# Patient Record
Sex: Male | Born: 1955 | Race: White | Hispanic: No | Smoking: Current every day smoker
Health system: Southern US, Community
[De-identification: ages and names within clinical notes are randomized; demographics above are authoritative.]

## PROBLEM LIST (undated history)

## (undated) DIAGNOSIS — E119 Type 2 diabetes mellitus without complications: Secondary | ICD-10-CM

## (undated) DIAGNOSIS — I10 Essential (primary) hypertension: Secondary | ICD-10-CM

## (undated) DIAGNOSIS — J449 Chronic obstructive pulmonary disease, unspecified: Secondary | ICD-10-CM

---

## 2017-04-10 ENCOUNTER — Other Ambulatory Visit: Payer: Self-pay

## 2017-04-10 ENCOUNTER — Emergency Department (HOSPITAL_COMMUNITY): Payer: Medicare Other

## 2017-04-10 ENCOUNTER — Encounter (HOSPITAL_COMMUNITY): Payer: Self-pay

## 2017-04-10 ENCOUNTER — Emergency Department (HOSPITAL_COMMUNITY)
Admission: EM | Admit: 2017-04-10 | Discharge: 2017-04-11 | Disposition: A | Discharge status: Home / Self Care | Payer: Medicare Other | Attending: Emergency Medicine | Admitting: Emergency Medicine

## 2017-04-10 DIAGNOSIS — R079 Chest pain, unspecified: Secondary | ICD-10-CM

## 2017-04-10 DIAGNOSIS — H538 Other visual disturbances: Secondary | ICD-10-CM | POA: Diagnosis not present

## 2017-04-10 DIAGNOSIS — R739 Hyperglycemia, unspecified: Secondary | ICD-10-CM

## 2017-04-10 DIAGNOSIS — E1165 Type 2 diabetes mellitus with hyperglycemia: Secondary | ICD-10-CM | POA: Diagnosis not present

## 2017-04-10 DIAGNOSIS — R0789 Other chest pain: Secondary | ICD-10-CM | POA: Diagnosis present

## 2017-04-10 DIAGNOSIS — R55 Syncope and collapse: Secondary | ICD-10-CM | POA: Diagnosis not present

## 2017-04-10 DIAGNOSIS — R0602 Shortness of breath: Secondary | ICD-10-CM | POA: Insufficient documentation

## 2017-04-10 DIAGNOSIS — F17210 Nicotine dependence, cigarettes, uncomplicated: Secondary | ICD-10-CM | POA: Diagnosis not present

## 2017-04-10 DIAGNOSIS — R51 Headache: Secondary | ICD-10-CM | POA: Diagnosis not present

## 2017-04-10 DIAGNOSIS — I10 Essential (primary) hypertension: Secondary | ICD-10-CM | POA: Insufficient documentation

## 2017-04-10 DIAGNOSIS — J449 Chronic obstructive pulmonary disease, unspecified: Secondary | ICD-10-CM | POA: Insufficient documentation

## 2017-04-10 DIAGNOSIS — R42 Dizziness and giddiness: Secondary | ICD-10-CM

## 2017-04-10 HISTORY — DX: Type 2 diabetes mellitus without complications: E11.9

## 2017-04-10 HISTORY — DX: Chronic obstructive pulmonary disease, unspecified: J44.9

## 2017-04-10 HISTORY — DX: Essential (primary) hypertension: I10

## 2017-04-10 LAB — I-STAT TROPONIN, ED
Troponin i, poc: 0 ng/mL (ref 0.00–0.08)
Troponin i, poc: 0 ng/mL (ref 0.00–0.08)

## 2017-04-10 LAB — CBC
HEMATOCRIT: 39.5 % (ref 39.0–52.0)
HEMOGLOBIN: 13.9 g/dL (ref 13.0–17.0)
MCH: 28.6 pg (ref 26.0–34.0)
MCHC: 35.2 g/dL (ref 30.0–36.0)
MCV: 81.3 fL (ref 78.0–100.0)
Platelets: 289 10*3/uL (ref 150–400)
RBC: 4.86 MIL/uL (ref 4.22–5.81)
RDW: 13.1 % (ref 11.5–15.5)
WBC: 14.6 10*3/uL — ABNORMAL HIGH (ref 4.0–10.5)

## 2017-04-10 LAB — BASIC METABOLIC PANEL
ANION GAP: 14 (ref 5–15)
BUN: 12 mg/dL (ref 6–20)
CALCIUM: 9.1 mg/dL (ref 8.9–10.3)
CHLORIDE: 91 mmol/L — AB (ref 101–111)
CO2: 25 mmol/L (ref 22–32)
Creatinine, Ser: 0.78 mg/dL (ref 0.61–1.24)
GFR calc non Af Amer: >60 mL/min (ref >60)
Glucose, Bld: 432 mg/dL — ABNORMAL HIGH (ref 65–99)
Potassium: 3.6 mmol/L (ref 3.5–5.1)
Sodium: 130 mmol/L — ABNORMAL LOW (ref 135–145)

## 2017-04-10 LAB — CBG MONITORING, ED: GLUCOSE-CAPILLARY: 320 mg/dL — AB (ref 65–99)

## 2017-04-10 MED ORDER — SODIUM CHLORIDE 0.9 % IV BOLUS (SEPSIS)
1000.0000 mL | Freq: Once | INTRAVENOUS | Status: AC
  Administered 2017-04-10: 1000 mL via INTRAVENOUS

## 2017-04-10 NOTE — ED Triage Notes (Signed)
Pt endorses left sided chest pain x 2 days and has been having episodes of dizziness, blurred vison and "passing out" "for a long time" VSS in triage. Pt is homeless. No neuro deficits.

## 2017-04-10 NOTE — ED Notes (Signed)
Pt alert and oriented x4. Pt talking in complete sentences with clear speech. Skin warm and dry. Respirations equal and unlabored. Abdomen soft and non distended. Pt states has dizziness, denies pain or weakness. Pt has 2 large bag of clothing/belongings at bedside.

## 2017-04-10 NOTE — ED Provider Notes (Signed)
Kenneth Finley [facility] EMERGENCY DEPARTMENT Provider Note   CSN: [id number] Arrival date & time: 04/10/17  1726     History   Chief Complaint Chief Complaint  Patient presents with  . Chest Pain    HPI BUEL MOLDER is a 62 y.o. male.  Patient with history of diabetes, reported hypertension, COPD, previous stroke --presents the emergency department tonight with a complaint of several days of chest pains as well as dizziness and also blurred vision.  Patient states that he was recently seen at [facility] and admitted overnight.  He remembers having a head CT and other testing done but cannot give further details.  Pain is described as left-sided chest pressure with radiation to the shoulder.  He has associated shortness of breath.  He denies fever, cough, nausea or vomiting.  He states that he has had dizziness with associated headache.  It is described as a spinning sensation with associated blurry vision.  Patient has been told that his blurry vision is related to his high blood sugar. Patient denies signs of stroke including: facial droop, slurred speech, aphasia, weakness/numbness in extremities, imbalance/trouble walking.  Patient states that he syncopized 3 times today while on a bus to [place].  He states that he hit his head but denies having any physical injury from this.  No neck pain.  No treatments prior to arrival.  Patient states that he currently takes metformin 1000 mg daily for his diabetes.  He reports a history of hypertension, smoking, DM and a family history of cardiac disease in his mother.  He has never had a heart attack himself.  Denies high cholesterol.  Patient denies risk factors for pulmonary embolism including: unilateral leg swelling, history of DVT/PE/other blood clots, use of exogenous hormones, recent immobilizations, recent surgery, recent travel (>4hr segment), malignancy, hemoptysis. T       Past Medical History:  Diagnosis Date  .  COPD (chronic obstructive pulmonary disease) (HCC)   . Diabetes mellitus without complication (HCC)   . Hypertension     There are no active problems to display for this patient.   History reviewed. No pertinent surgical history.     Home Medications    Prior to Admission medications   Not on File    Family History History reviewed. No pertinent family history.  Social History Social History   Tobacco Use  . Smoking status: Current Every Day Smoker    Packs/day: 0.50    Types: Cigarettes  Substance Use Topics  . Alcohol use: No    Frequency: Never  . Drug use: No     Allergies   Bee venom; Tetanus toxoids; Morphine and related; and Penicillins   Review of Systems Review of Systems  Constitutional: Negative for diaphoresis and fever.  HENT: Negative for congestion, dental problem, rhinorrhea and sinus pressure.   Eyes: Positive for visual disturbance. Negative for photophobia, discharge and redness.  Respiratory: Positive for shortness of breath. Negative for cough.   Cardiovascular: Positive for chest pain. Negative for palpitations and leg swelling.  Gastrointestinal: Negative for abdominal pain, nausea and vomiting.  Genitourinary: Negative for dysuria.  Musculoskeletal: Negative for back pain, gait problem, neck pain and neck stiffness.  Skin: Negative for rash.  Neurological: Positive for syncope and headaches. Negative for speech difficulty, weakness, light-headedness and numbness.  Psychiatric/Behavioral: Negative for confusion. The patient is not nervous/anxious.      Physical Exam Updated Vital Signs BP 123/74   Pulse (!) 179   Temp (!)  97.5 F (36.4 C) (Oral)   Resp (!) 27   SpO2 92%   Physical Exam  Constitutional: He is oriented to person, place, and time. He appears well-developed and well-nourished.  HENT:  Head: Normocephalic and atraumatic.  Right Ear: Tympanic membrane, external ear and ear canal normal.  Left Ear: Tympanic  membrane, external ear and ear canal normal.  Nose: Nose normal.  Mouth/Throat: Uvula is midline, oropharynx is clear and moist and mucous membranes are normal.  Eyes: Conjunctivae, EOM and lids are normal. Pupils are equal, round, and reactive to light. Right eye exhibits no discharge. Left eye exhibits no discharge.  Neck: Normal range of motion. Neck supple.  Cardiovascular: Normal rate, regular rhythm and normal heart sounds.  No murmur heard. HR 90's  Pulmonary/Chest: Effort normal and breath sounds normal. He has no decreased breath sounds. He has no wheezes. He has no rales.  Abdominal: Soft. There is no tenderness.  Musculoskeletal: Normal range of motion. He exhibits no edema or tenderness.       Cervical back: He exhibits normal range of motion, no tenderness and no bony tenderness.  No clinical signs and symptoms of DVT.  Neurological: He is alert and oriented to person, place, and time. He has normal strength and normal reflexes. No cranial nerve deficit or sensory deficit. He exhibits normal muscle tone. He displays a negative Romberg sign. Coordination and gait normal. GCS eye subscore is 4. GCS verbal subscore is 5. GCS motor subscore is 6.  Skin: Skin is warm and dry.  Psychiatric: He has a normal mood and affect.  Nursing note and vitals reviewed.    ED Treatments / Results  Labs (all labs ordered are listed, but only abnormal results are displayed) Labs Reviewed  BASIC METABOLIC PANEL - Abnormal; Notable for the following components:      Result Value   Sodium 130 (*)    Chloride 91 (*)    Glucose, Bld 432 (*)    All other components within normal limits  CBC - Abnormal; Notable for the following components:   WBC 14.6 (*)    All other components within normal limits  CBG MONITORING, ED - Abnormal; Notable for the following components:   Glucose-Capillary 320 (*)    All other components within normal limits  D-DIMER, QUANTITATIVE (NOT AT [facility])  I-STAT TROPONIN,  ED  I-STAT TROPONIN, ED    EKG  EKG Interpretation  Date/Time:  Monday April 10 2017 23:17:36 EST Ventricular Rate:  89 PR Interval:    QRS Duration: 84 QT Interval:  378 QTC Calculation: 460 R Axis:   50 Text Interpretation:  Sinus rhythm Confirmed by Ross Marcus ([id number]) on 04/11/2017 1:56:14 AM       Radiology Dg Chest 2 View  Result Date: 04/10/2017 CLINICAL DATA:  Left chest pain for 2 days. Dizziness with blurred vision. History of syncopal episodes. EXAM: CHEST  2 VIEW COMPARISON:  None. FINDINGS: The heart size and mediastinal contours are normal without evidence of adenopathy. The lungs appear mildly hyperinflated with mild central airway thickening. There is a well-circumscribed, calcified right lower lobe granuloma. No suspicious pulmonary nodule or confluent airspace opacity. There is no pleural effusion or pneumothorax. No acute osseous findings are seen. IMPRESSION: No apparent acute cardiopulmonary process. Right lung granuloma and diffuse central airway thickening. Electronically Signed   By: Carey Bullocks M.D.   On: 04/10/2017 18:28    Procedures Procedures (including critical care time)  Medications Ordered in ED Medications  sodium chloride 0.9 % bolus 1,000 mL (0 mLs Intravenous Stopped 04/11/17 0052)     Initial Impression / Assessment and Plan / ED Course  I have reviewed the triage vital signs and the nursing notes.  Pertinent labs & imaging results that were available during my care of the patient were reviewed by me and considered in my medical decision making (see chart for details).     Patient seen and examined.  Initial workup demonstrates hyperglycemia without DKA.  Also elevated white blood cell count.  No pneumonia on chest x-ray.  Vital signs reviewed and are as follows: BP 123/74   Pulse (!) 179   Temp (!) 97.5 F (36.4 C) (Oral)   Resp (!) 27   SpO2 92%   ED ECG REPORT   Date: 04/11/2017  Rate:  115  Rhythm: sinus  tachycardia  QRS Axis: normal  Intervals: normal  ST/T Wave abnormalities: nonspecific ST/T changes  Conduction Disutrbances:none  Narrative Interpretation:   Old EKG Reviewed: none available  I have personally reviewed the EKG tracing and agree with the computerized printout as noted.   Will check d-dimer, troponin and repeat EKG.  [facility] everywhere.  Vital signs at 2330 show heart rate of 179.  I went and checked on the patient and heart rate was in the 80s on the monitor.  Suspect this is an error.  12:05 AM D-dimer pending. 2nd trop negative.   ED ECG REPORT   Date: 04/11/2017  Rate: 89  Rhythm: normal sinus rhythm  QRS Axis: normal  Intervals: normal  ST/T Wave abnormalities: normal  Conduction Disutrbances:none  Narrative Interpretation:   Old EKG Reviewed: changes noted, slower  I have personally reviewed the EKG tracing and agree with the computerized printout as noted.  12:18 AM D-dimer needed to be reordered. Pending.   2:08 AM I discussed patient and workup, recent history with Dr. Wilkie AyeHorton.  Agrees, no indications for admission or further workup.  I discussed results again with the patient in his room.  He is agreeable to discharge.  Discussed need to follow-up with primary care, referral given.  Also given referrals for shelters and social services.  Patient was counseled to return with severe chest pain, especially if the pain is crushing or pressure-like and spreads to the arms, back, neck, or jaw, or if they have sweating, nausea, or shortness of breath with the pain. They were encouraged to call 911 with these symptoms.   The patient verbalized understanding and agreed.    Final Clinical Impressions(s) / ED Diagnoses   Final diagnoses:  Chest pain, unspecified type  Dizziness  Hyperglycemia   Chest pain: EKG unchanged and troponin negative x2.  Chest x-ray   Does not show pneumonia.  D-dimer is  negative.  Dizziness: No focal neuro deficits on exam.  Patient has not developed any deficits during ED stay.  He has ambulated.  He is not ataxic.  I do not suspect a posterior circulation stroke.  Question syncope today.  Possibly dehydrated due to uncontrolled diabetes.  Fluids given here.  Hyperglycemia: No signs of DKA.  Treated with IV fluids with improvement in blood sugar to 300.  Patient to continue metformin.  ED Discharge Orders    None       Renne CriglerGeiple, Dilcia Rybarczyk, PA-C 04/11/17 0211    Shon BatonHorton, Courtney F, MD 04/11/17 [phone]

## 2017-04-11 LAB — CBG MONITORING, ED: GLUCOSE-CAPILLARY: 300 mg/dL — AB (ref 65–99)

## 2017-04-11 LAB — D-DIMER, QUANTITATIVE: D-Dimer, Quant: 0.49 ug/mL-FEU (ref 0.00–0.50)

## 2017-04-11 NOTE — ED Notes (Signed)
Pt maintained oxygen saturation above 94% while ambulating to the end of the hall. Pt states has been walking all day with luggage and his feet were weak from long walk trip. Pt walked with assistance.

## 2017-04-11 NOTE — ED Notes (Signed)
CBG 300. 

## 2017-04-11 NOTE — Discharge Instructions (Signed)
Please read and follow all provided instructions.  Your diagnoses today include:  1. Chest pain, unspecified type   2. Dizziness   3. Hyperglycemia     Tests performed today include: An EKG of your heart A chest x-ray Cardiac enzymes - a blood test for heart muscle damage, no sign of heart attack Blood counts and electrolytes Blood test for blood clot -no sign of blood clot Vital signs. See below for your results today.   Medications prescribed:  None   Take any prescribed medications only as directed.  Follow-up instructions: Please follow-up with your primary care provider as soon as you can for further evaluation of your symptoms.   Return instructions:  SEEK IMMEDIATE MEDICAL ATTENTION IF: You have severe chest pain, especially if the pain is crushing or pressure-like and spreads to the arms, back, neck, or jaw, or if you have sweating, nausea (feeling sick to your stomach), or shortness of breath. THIS IS AN EMERGENCY. Don't wait to see if the pain will go away. Get medical help at once. Call 911 or 0 (operator). DO NOT drive yourself to the hospital.  Your chest pain gets worse and does not go away with rest.  You have an attack of chest pain lasting longer than usual, despite rest and treatment with the medications your caregiver has prescribed.  You wake from sleep with chest pain or shortness of breath. You feel dizzy or faint. You have chest pain not typical of your usual pain for which you originally saw your caregiver.  You have any other emergent concerns regarding your health.  Additional Information: Chest pain comes from many different causes. Your caregiver has diagnosed you as having chest pain that is not specific for one problem, but does not require admission.  You are at low risk for an acute heart condition or other serious illness.   Your vital signs today were: BP 118/75    Pulse 90    Temp (!) 97.5 F (36.4 C) (Oral)    Resp (!) 26    SpO2 91%  If  your blood pressure (BP) was elevated above 135/85 this visit, please have this repeated by your doctor within one month. --------------

## 2017-06-22 ENCOUNTER — Emergency Department (HOSPITAL_COMMUNITY): Payer: Medicare Other

## 2017-06-22 ENCOUNTER — Other Ambulatory Visit: Payer: Self-pay

## 2017-06-22 ENCOUNTER — Encounter (HOSPITAL_COMMUNITY): Payer: Self-pay | Admitting: Emergency Medicine

## 2017-06-22 ENCOUNTER — Emergency Department (HOSPITAL_COMMUNITY)
Admission: EM | Admit: 2017-06-22 | Discharge: 2017-06-23 | Disposition: A | Discharge status: Home / Self Care | Payer: Medicare Other | Attending: Emergency Medicine | Admitting: Emergency Medicine

## 2017-06-22 DIAGNOSIS — E860 Dehydration: Secondary | ICD-10-CM

## 2017-06-22 DIAGNOSIS — E119 Type 2 diabetes mellitus without complications: Secondary | ICD-10-CM | POA: Diagnosis not present

## 2017-06-22 DIAGNOSIS — R42 Dizziness and giddiness: Secondary | ICD-10-CM

## 2017-06-22 DIAGNOSIS — J449 Chronic obstructive pulmonary disease, unspecified: Secondary | ICD-10-CM | POA: Insufficient documentation

## 2017-06-22 DIAGNOSIS — F17210 Nicotine dependence, cigarettes, uncomplicated: Secondary | ICD-10-CM | POA: Diagnosis not present

## 2017-06-22 DIAGNOSIS — I10 Essential (primary) hypertension: Secondary | ICD-10-CM | POA: Insufficient documentation

## 2017-06-22 DIAGNOSIS — R05 Cough: Secondary | ICD-10-CM | POA: Insufficient documentation

## 2017-06-22 LAB — CBC WITH DIFFERENTIAL/PLATELET
BASOS ABS: 0 10*3/uL (ref 0.0–0.1)
BASOS PCT: 0 %
EOS PCT: 2 %
Eosinophils Absolute: 0.1 10*3/uL (ref 0.0–0.7)
HEMATOCRIT: 43.5 % (ref 39.0–52.0)
Hemoglobin: 15.4 g/dL (ref 13.0–17.0)
Lymphocytes Relative: 20 %
Lymphs Abs: 1.3 10*3/uL (ref 0.7–4.0)
MCH: 28.7 pg (ref 26.0–34.0)
MCHC: 35.4 g/dL (ref 30.0–36.0)
MCV: 81 fL (ref 78.0–100.0)
MONO ABS: 0.5 10*3/uL (ref 0.1–1.0)
MONOS PCT: 7 %
NEUTROS ABS: 4.6 10*3/uL (ref 1.7–7.7)
Neutrophils Relative %: 71 %
PLATELETS: 122 10*3/uL — AB (ref 150–400)
RBC: 5.37 MIL/uL (ref 4.22–5.81)
RDW: 15 % (ref 11.5–15.5)
WBC: 6.5 10*3/uL (ref 4.0–10.5)

## 2017-06-22 LAB — COMPREHENSIVE METABOLIC PANEL
ALT: 18 U/L (ref 17–63)
ANION GAP: 13 (ref 5–15)
AST: 18 U/L (ref 15–41)
Albumin: 3.9 g/dL (ref 3.5–5.0)
Alkaline Phosphatase: 91 U/L (ref 38–126)
BUN: 17 mg/dL (ref 6–20)
CALCIUM: 8.9 mg/dL (ref 8.9–10.3)
CO2: 22 mmol/L (ref 22–32)
Chloride: 99 mmol/L — ABNORMAL LOW (ref 101–111)
Creatinine, Ser: 0.71 mg/dL (ref 0.61–1.24)
GFR calc Af Amer: >60 mL/min (ref >60)
GLUCOSE: 295 mg/dL — AB (ref 65–99)
POTASSIUM: 3.6 mmol/L (ref 3.5–5.1)
SODIUM: 134 mmol/L — AB (ref 135–145)
Total Bilirubin: 0.7 mg/dL (ref 0.3–1.2)
Total Protein: 6.8 g/dL (ref 6.5–8.1)

## 2017-06-22 LAB — CBG MONITORING, ED: Glucose-Capillary: 272 mg/dL — ABNORMAL HIGH (ref 65–99)

## 2017-06-22 LAB — I-STAT TROPONIN, ED: Troponin i, poc: 0.01 ng/mL (ref 0.00–0.08)

## 2017-06-22 LAB — ETHANOL

## 2017-06-22 MED ORDER — SODIUM CHLORIDE 0.9 % IV BOLUS
1000.0000 mL | Freq: Once | INTRAVENOUS | Status: AC
  Administered 2017-06-22: 1000 mL via INTRAVENOUS

## 2017-06-22 MED ORDER — LORAZEPAM 2 MG/ML IJ SOLN
1.0000 mg | Freq: Once | INTRAMUSCULAR | Status: AC
  Administered 2017-06-22: 1 mg via INTRAVENOUS
  Filled 2017-06-22: qty 1

## 2017-06-22 NOTE — ED Provider Notes (Signed)
[facility]-EMERGENCY DEPT Provider Note   CSN: [id number] Arrival date & time: 06/22/17  1934     History   Chief Complaint Chief Complaint  Patient presents with  . Dizziness  . Dehydration    HPI Kenneth Finley is a 62 y.o. male with a hx of COPD, NIDDM (no current medications), HTN presents to the Emergency Department complaining of gradual, persistent, progressively worsening dizziness onset around 11 AM. Associated symptoms include blurred vision.  Patient does deny diplopia.  Patient reports that if he lies flat and closes his eyes the dizziness stops but if he opens his eyes, sits up or tries to walk the dizziness becomes worse.  He reports he had some mild nausea but did not have any vomiting, diaphoresis, chest pain.  He reports he thinks he may be getting a cold as he has been coughing but is without fevers, chills, headache, neck pain, abdominal pain, vomiting, diarrhea, syncope.  Patient reports dizziness is room spinning.  Lying flat makes it better and changing position makes it worse.    The history is provided by the patient and medical records. No language interpreter was used.    Past Medical History:  Diagnosis Date  . COPD (chronic obstructive pulmonary disease) (HCC)   . Diabetes mellitus without complication (HCC)   . Hypertension     There are no active problems to display for this patient.   History reviewed. No pertinent surgical history.      Home Medications    Prior to Admission medications   Medication Sig Start Date End Date Taking? Authorizing Provider  meclizine (ANTIVERT) 12.5 MG tablet Take 1 tablet (12.5 mg total) by mouth 3 (three) times daily as needed for dizziness. 06/23/17   Hanaan Gancarz, Dahlia Client, PA-C    Family History No family history on file.  Social History Social History   Tobacco Use  . Smoking status: Current Every Day Smoker    Packs/day: 0.50    Types: Cigarettes  Substance Use Topics  .  Alcohol use: No    Frequency: Never  . Drug use: No     Allergies   Bee venom; Tetanus toxoids; Morphine and related; and Penicillins   Review of Systems Review of Systems  Constitutional: Negative for appetite change, diaphoresis, fatigue, fever and unexpected weight change.  HENT: Negative for mouth sores.   Eyes: Positive for visual disturbance.  Respiratory: Positive for cough. Negative for chest tightness, shortness of breath and wheezing.   Cardiovascular: Negative for chest pain.  Gastrointestinal: Negative for abdominal pain, constipation, diarrhea, nausea and vomiting.  Endocrine: Negative for polydipsia, polyphagia and polyuria.  Genitourinary: Negative for dysuria, frequency, hematuria and urgency.  Musculoskeletal: Negative for back pain and neck stiffness.  Skin: Negative for rash.  Allergic/Immunologic: Negative for immunocompromised state.  Neurological: Positive for dizziness. Negative for syncope, light-headedness and headaches.  Hematological: Does not bruise/bleed easily.  Psychiatric/Behavioral: Negative for sleep disturbance. The patient is not nervous/anxious.      Physical Exam Updated Vital Signs BP 109/69 (BP Location: Right Arm)   Pulse (!) 105   Resp 18   Ht 6' (1.829 m)   Wt 63.5 kg (140 lb)   SpO2 99%   BMI 18.99 kg/m   Physical Exam  Constitutional: He is oriented to person, place, and time. He appears well-developed and well-nourished. No distress.  HENT:  Head: Normocephalic and atraumatic.  Mouth/Throat: Oropharynx is clear and moist.  Eyes: Pupils are equal, round, and reactive to  light. Conjunctivae and EOM are normal. No scleral icterus.  No horizontal, vertical or rotational nystagmus  Neck: Normal range of motion. Neck supple.  Full active and passive ROM without pain No midline or paraspinal tenderness No nuchal rigidity or meningeal signs  Cardiovascular: Regular rhythm and intact distal pulses. Tachycardia present.   Pulses:      Radial pulses are 2+ on the right side, and 2+ on the left side.       Dorsalis pedis pulses are 2+ on the right side, and 2+ on the left side.  Pulmonary/Chest: Effort normal and breath sounds normal. No respiratory distress. He has no wheezes. He has no rales.  Abdominal: Soft. Bowel sounds are normal. There is no tenderness. There is no rebound and no guarding.  Musculoskeletal: Normal range of motion.  Lymphadenopathy:    He has no cervical adenopathy.  Neurological: He is alert and oriented to person, place, and time. No cranial nerve deficit. He exhibits normal muscle tone. Coordination normal.  Mental Status:  Alert, oriented, thought content appropriate. Speech fluent without evidence of aphasia. Able to follow 2 step commands without difficulty.  Cranial Nerves:  II:  Peripheral visual fields grossly normal, pupils equal, round, reactive to light III,IV, VI: ptosis not present, extra-ocular motions intact bilaterally  V,VII: smile symmetric, facial light touch sensation equal VIII: hearing grossly normal bilaterally  IX,X: midline uvula rise  XI: bilateral shoulder shrug equal and strong XII: midline tongue extension  Motor:  5/5 in upper and lower extremities bilaterally including strong and equal grip strength and dorsiflexion/plantar flexion Sensory: Light touch normal in all extremities.  Cerebellar: normal finger-to-nose with bilateral upper extremities; normal heel shin Gait: gait testing deferred CV: distal pulses palpable throughout   Skin: Skin is warm and dry. No rash noted. He is not diaphoretic.  Psychiatric: He has a normal mood and affect. His behavior is normal. Judgment and thought content normal.  Nursing note and vitals reviewed.    ED Treatments / Results  Labs (all labs ordered are listed, but only abnormal results are displayed) Labs Reviewed  CBC WITH DIFFERENTIAL/PLATELET - Abnormal; Notable for the following components:      Result  Value   Platelets 122 (*)    All other components within normal limits  COMPREHENSIVE METABOLIC PANEL - Abnormal; Notable for the following components:   Sodium 134 (*)    Chloride 99 (*)    Glucose, Bld 295 (*)    All other components within normal limits  URINALYSIS, ROUTINE W REFLEX MICROSCOPIC - Abnormal; Notable for the following components:   Specific Gravity, Urine 1.036 (*)    Glucose, UA >=500 (*)    Ketones, ur 20 (*)    All other components within normal limits  CBG MONITORING, ED - Abnormal; Notable for the following components:   Glucose-Capillary 272 (*)    All other components within normal limits  RAPID URINE DRUG SCREEN, HOSP PERFORMED  ETHANOL  I-STAT TROPONIN, ED    EKG EKG Interpretation  Date/Time:  Thursday Jun 22 2017 22:24:09 EDT Ventricular Rate:  87 PR Interval:    QRS Duration: 86 QT Interval:  366 QTC Calculation: 441 R Axis:   50 Text Interpretation:  Sinus rhythm Low voltage, precordial leads No significant change since last tracing Confirmed by Melene Plan [phone]) on 06/22/2017 10:27:37 PM   Radiology Dg Chest 2 View  Result Date: 06/22/2017 CLINICAL DATA:  Cough, dizziness. EXAM: CHEST - 2 VIEW COMPARISON:  Chest x-ray dated  04/10/2017. FINDINGS: Heart size and mediastinal contours are within normal limits. Stable calcified nodule within the RIGHT lung, compatible with benign granuloma. Questionable small nodular density within the LEFT upper lung. No confluent opacity to suggest a developing pneumonia. No evidence of pulmonary edema. No pleural effusion or pneumothorax seen. IMPRESSION: 1. No active cardiopulmonary disease. No evidence of pneumonia or pulmonary edema. 2. Questionable small nodular density within the LEFT upper lung, suspected confluence/superimposition of normal pulmonary vessels. Recommend follow-up chest x-ray in 3-6 months to ensure stability. Electronically Signed   By: Bary Richard M.D.   On: 06/22/2017 23:51     Procedures Procedures (including critical care time)  Medications Ordered in ED Medications  sodium chloride 0.9 % bolus 1,000 mL (0 mLs Intravenous Stopped 06/22/17 2320)  LORazepam (ATIVAN) injection 1 mg (1 mg Intravenous Given 06/22/17 2351)  sodium chloride 0.9 % bolus 1,000 mL (0 mLs Intravenous Stopped 06/23/17 0257)     Initial Impression / Assessment and Plan / ED Course  I have reviewed the triage vital signs and the nursing notes.  Pertinent labs & imaging results that were available during my care of the patient were reviewed by me and considered in my medical decision making (see chart for details).  Clinical Course as of Jun 24 735  Fri Jun 23, 2017  0143 Pt reports he is feeling significantly better.  He reports his dizziness has improved significantly.  Tachycardia has improved.   [HM]  [phone] Patient is able to ambulate unassisted with steady gait.  No ataxia. He reports no dizziness or lightheadedness when standing or walking.     [HM]  0238 Increased; suspect dehydration.    Specific Gravity, Urine(!): 1.036 [HM]  0238 Pt reports taking oral meds for diabetes.  He reports compliance.    Glucose-Capillary(!): 272 [HM]  0239 neg  Troponin i, poc: 0.01 [HM]  0239 neg  Alcohol, Ethyl (B): <10 [HM]  0239 Neg  Rapid urine drug screen (hospital performed) [HM]  0239 Tachycardia improved  Pulse Rate: 97 [HM]    Clinical Course User Index [HM] Maxfield Gildersleeve, Dahlia Client, PA-C    Patient presents to the emergency department with complaints of dizziness and lightheadedness.  He states he was outside today and attempted to maintain his hydration status but thinks he may be dehydrated.  Mid day he developed dizziness described as room spinning.  On initial neurologic exam, gait testing was deferred but patient was able to complete a normal neurologic exam.  He is tachycardic and orthostatic.  Urinalysis with increased specific gravity.  Additionally, patient with elevated  blood sugar.  Fluids given along with Ativan.  On reassessment, patient with significant improvement in his vertiginous symptoms.  He was given 2 L of fluid.  After 2 L of fluid and Ativan, patient's vertigo had completely resolved and he was able to ambulate unassisted here in the emergency department.  Highly doubt central cause of patient's vertigo.  Repeat orthostatics were negative.  Patient well-appearing and no longer tachycardic.  He states he feels well and wishes for discharge home.  He has tolerated p.o. here without emesis.  He has reasons to return immediately to the emergency department including new, worsening or persistent symptoms.  Patient states understanding and is in agreement with this plan.    Orthostatic VS for the past 24 hrs:  BP- Lying Pulse- Lying BP- Sitting Pulse- Sitting BP- Standing at 0 minutes Pulse- Standing at 0 minutes  06/23/17 0321 103/67 88 105/66 95 96/63 102  06/22/17 2231 131/79 88 123/71 94 100/63 102     Final Clinical Impressions(s) / ED Diagnoses   Final diagnoses:  Dehydration  Dizziness  Vertigo    ED Discharge Orders        Ordered    meclizine (ANTIVERT) 12.5 MG tablet  3 times daily PRN     06/23/17 0347       Ople Girgis, Dahlia Client, PA-C 06/23/17 0740    Melene Plan, DO 06/23/17 1633

## 2017-06-22 NOTE — ED Notes (Signed)
Pt. CBG 272. RN,Sonya made aware.

## 2017-06-22 NOTE — ED Triage Notes (Addendum)
Pt here via EMS with c/o dizziness. Pt is homeless and has been in the sun. Pt was ambulatory but reports dizziness with change of position. Pt has sinus rhythm.   20G rt forearm

## 2017-06-22 NOTE — ED Notes (Signed)
EKG given to EDP,Floyd,MD., for review. 

## 2017-06-23 LAB — RAPID URINE DRUG SCREEN, HOSP PERFORMED
Amphetamines: NOT DETECTED
BENZODIAZEPINES: NOT DETECTED
Barbiturates: NOT DETECTED
COCAINE: NOT DETECTED
OPIATES: NOT DETECTED
TETRAHYDROCANNABINOL: NOT DETECTED

## 2017-06-23 LAB — URINALYSIS, ROUTINE W REFLEX MICROSCOPIC
BACTERIA UA: NONE SEEN
Bilirubin Urine: NEGATIVE
Glucose, UA: >=500 mg/dL — AB
Hgb urine dipstick: NEGATIVE
Ketones, ur: 20 mg/dL — AB
LEUKOCYTES UA: NEGATIVE
NITRITE: NEGATIVE
PROTEIN: NEGATIVE mg/dL
SPECIFIC GRAVITY, URINE: 1.036 — AB (ref 1.005–1.030)
pH: 5 (ref 5.0–8.0)

## 2017-06-23 MED ORDER — SODIUM CHLORIDE 0.9 % IV BOLUS
1000.0000 mL | Freq: Once | INTRAVENOUS | Status: AC
  Administered 2017-06-23: 1000 mL via INTRAVENOUS

## 2017-06-23 MED ORDER — MECLIZINE HCL 12.5 MG PO TABS: 12.5000 mg | ORAL_TABLET | Freq: Three times a day (TID) | ORAL | 0 refills | Status: AC | PRN

## 2017-06-23 NOTE — Discharge Instructions (Addendum)
1. Medications: Meclazine, usual home medications 2. Treatment: rest, drink plenty of fluids, 3. Follow Up: Please followup with your primary doctor in 1-2 days for discussion of your diagnoses and further evaluation after today's visit; if you do not have a primary care doctor use the resource guide provided to find one; Please return to the ER for return of persistent dizziness, difficulty walking, changes in vision, vomiting, fevers or other concerns.

## 2020-05-05 IMAGING — CR DG CHEST 2V
2 series · 2 of 2 positions shown · non-contrast
Comparison: Chest x-ray dated 04/10/2017.

CLINICAL DATA: Cough, dizziness.

EXAM:
CHEST - 2 VIEW

[w chest lat]
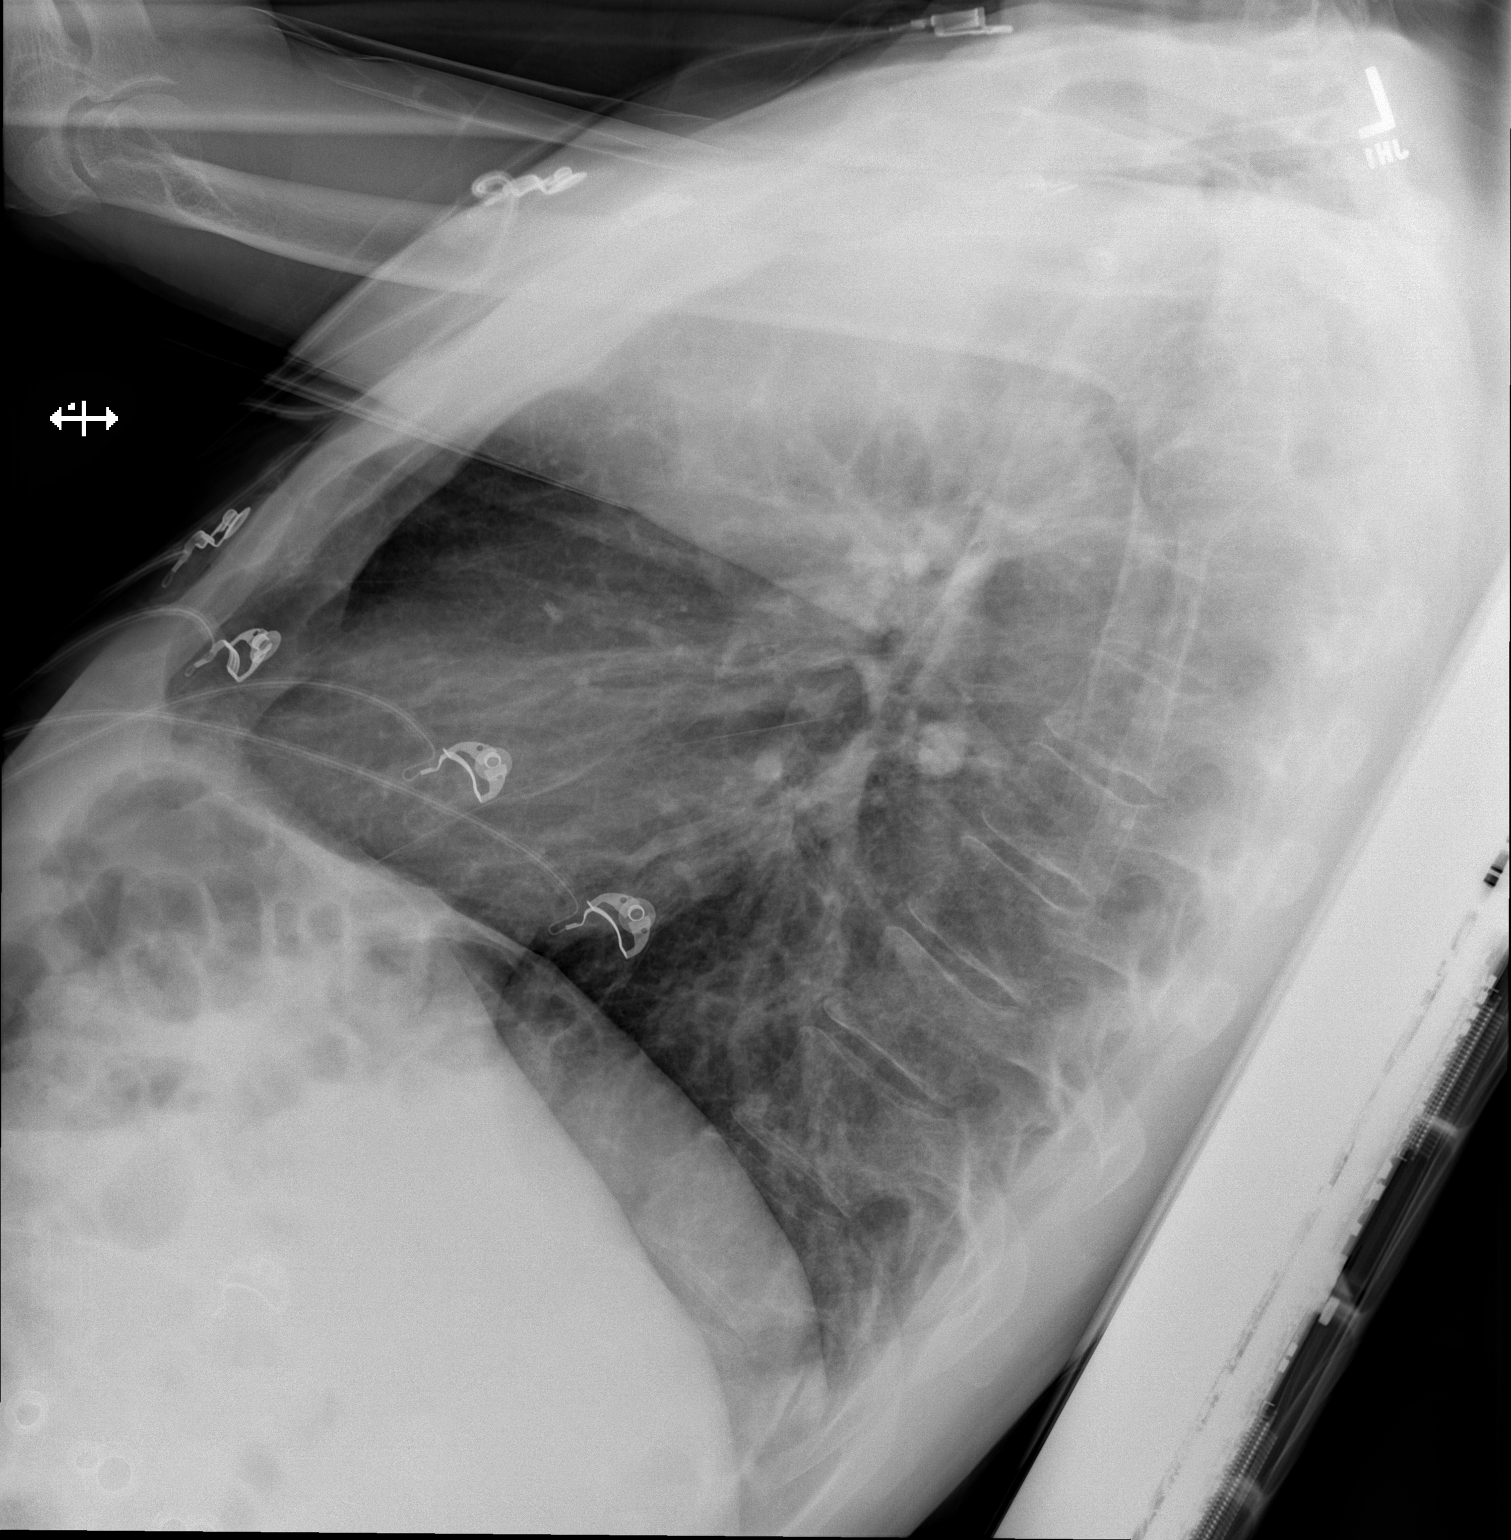

[x chest ap]
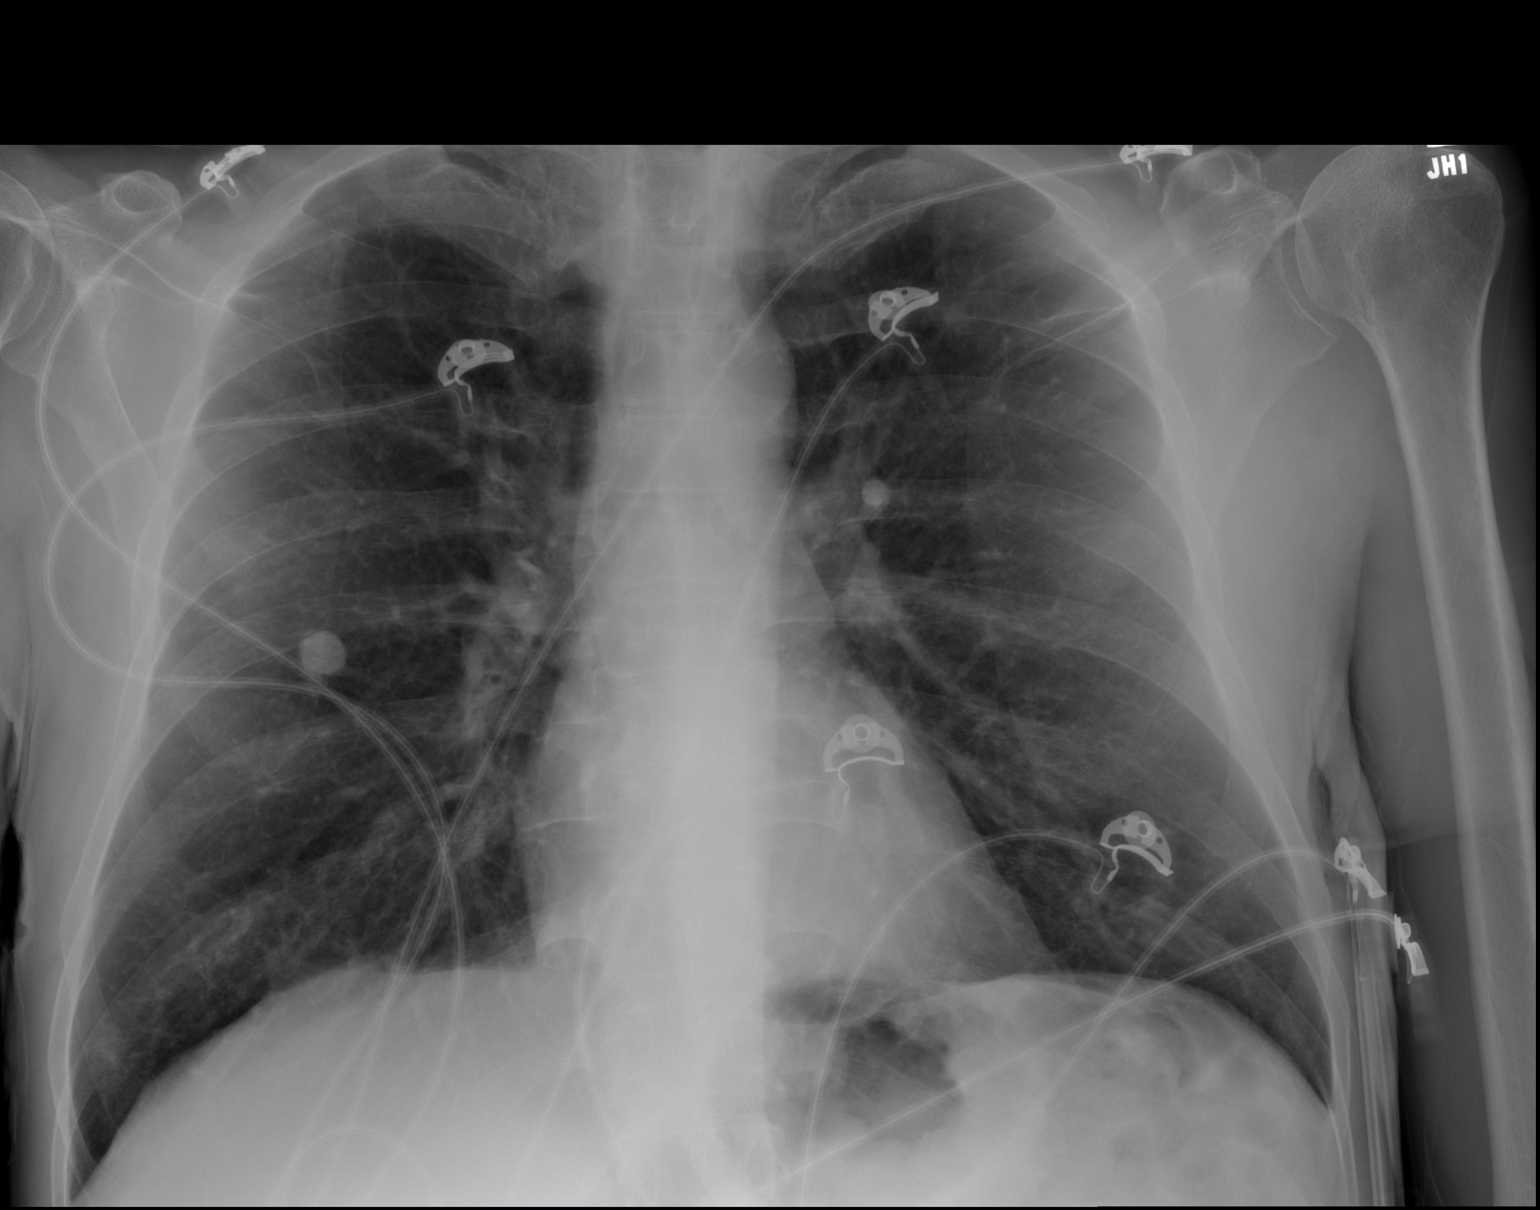

[2 of 2 positions shown; findings below may reference images not displayed]

FINDINGS: Heart size and mediastinal contours are within normal limits. Stable
calcified nodule within the RIGHT lung, compatible with benign
granuloma. Questionable small nodular density within the LEFT upper
lung. No confluent opacity to suggest a developing pneumonia. No
evidence of pulmonary edema. No pleural effusion or pneumothorax
seen.
IMPRESSION: 1. No active cardiopulmonary disease. No evidence of pneumonia or
pulmonary edema.
2. Questionable small nodular density within the LEFT upper lung,
suspected confluence/superimposition of normal pulmonary vessels.
Recommend follow-up chest x-ray in 3-6 months to ensure stability.
